# Patient Record
Sex: Male | Born: 1995 | Race: Black or African American | Hispanic: No | Marital: Single | Smoking: Never smoker
Health system: Southern US, Community
[De-identification: ages and names within clinical notes are randomized; demographics above are authoritative.]

## PROBLEM LIST (undated history)

## (undated) DIAGNOSIS — K589 Irritable bowel syndrome without diarrhea: Secondary | ICD-10-CM

## (undated) HISTORY — PX: APPENDECTOMY: SHX54

---
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
## 2016-09-06 ENCOUNTER — Emergency Department (HOSPITAL_COMMUNITY): Payer: BLUE CROSS/BLUE SHIELD

## 2016-09-06 ENCOUNTER — Emergency Department (HOSPITAL_COMMUNITY)
Admission: EM | Admit: 2016-09-06 | Discharge: 2016-09-06 | Disposition: A | Discharge status: Home / Self Care | Payer: BLUE CROSS/BLUE SHIELD | Attending: Emergency Medicine | Admitting: Emergency Medicine

## 2016-09-06 ENCOUNTER — Encounter (HOSPITAL_COMMUNITY): Payer: Self-pay | Admitting: Emergency Medicine

## 2016-09-06 DIAGNOSIS — R079 Chest pain, unspecified: Secondary | ICD-10-CM | POA: Insufficient documentation

## 2016-09-06 DIAGNOSIS — K625 Hemorrhage of anus and rectum: Secondary | ICD-10-CM

## 2016-09-06 HISTORY — DX: Irritable bowel syndrome, unspecified: K58.9

## 2016-09-06 LAB — CBC
HCT: 45.2 % (ref 39.0–52.0)
Hemoglobin: 16 g/dL (ref 13.0–17.0)
MCH: 30.8 pg (ref 26.0–34.0)
MCHC: 35.4 g/dL (ref 30.0–36.0)
MCV: 87.1 fL (ref 78.0–100.0)
Platelets: 184 10*3/uL (ref 150–400)
RBC: 5.19 MIL/uL (ref 4.22–5.81)
RDW: 12.6 % (ref 11.5–15.5)
WBC: 3.3 10*3/uL — ABNORMAL LOW (ref 4.0–10.5)

## 2016-09-06 LAB — BASIC METABOLIC PANEL
Anion gap: 7 (ref 5–15)
BUN: 11 mg/dL (ref 6–20)
CO2: 27 mmol/L (ref 22–32)
Calcium: 9.3 mg/dL (ref 8.9–10.3)
Chloride: 104 mmol/L (ref 101–111)
Creatinine, Ser: 1.04 mg/dL (ref 0.61–1.24)
GFR calc Af Amer: >60 mL/min (ref >60)
GFR calc non Af Amer: >60 mL/min (ref >60)
Glucose, Bld: 95 mg/dL (ref 65–99)
Potassium: 4.1 mmol/L (ref 3.5–5.1)
Sodium: 138 mmol/L (ref 135–145)

## 2016-09-06 LAB — I-STAT TROPONIN, ED: Troponin i, poc: 0 ng/mL (ref 0.00–0.08)

## 2016-09-06 NOTE — ED Provider Notes (Signed)
[facility]-EMERGENCY DEPT Provider Note   CSN: [id number] Arrival date & time: 09/06/16  1407  By signing my name below, I, Rosario AdieWilliam Andrew Hiatt, attest that this documentation has been prepared under the direction and in the presence of Raeford RazorStephen Miro Balderson, MD. Electronically Signed: Rosario AdieWilliam Andrew Hiatt, ED Scribe. 09/06/16. 9:20 PM.  History   Chief Complaint Chief Complaint  Patient presents with  . Chest Pain  . Rectal Bleeding   The history is provided by the patient. No language interpreter was used.    HPI Comments: Trevor Herring is a 21 y.o. male with a h/o IBS, who presents to the Emergency Department complaining of intermittent episodes of posterior neck pain which radiates downward into his lower back onset approximately four days ago. He describes his pain as stinging and aching, and he notes that his episodes of pain are variable in how long they last. Pt additionally states that he will become short of breath, dizzy, and experience intermittent sharp chest pain occasionally accompanied with his neck pain. Pt states that his chest pain will typically radiate into his left arm, and this will occasionally be accompanied by paraesthesias and subjective weakness into his forearm and hand localized around his to his 3rd and 4th digits. He is asymptomatic while in the ED. Pt also reports that he has felt mildly fatigued from his baseline since the onset of his pain. He also notes that over the past two days that he has had two episodes of bright-red rectal bleeding which he has noticed while wiping. No passage of blood with stool otherwise and his recent bowel movements have been soft. No recent straining, heavy lifting, or change in his daily routine. His neck/chest pain is sometimes exacerbated and precipitated with bowel movements, but otherwise there is no pattern to his pain; however, he notes that he has been more anxious d/t his symptoms and has noticed his pain with his increased anxiety. No  noted treatments for his symptoms were tried prior to coming into the ED. No recent illnesses/infections. Pt is currently followed by a PCP. He denies leg swelling, abdominal pain, rectal pain, or any other associated symptoms.   Past Medical History:  Diagnosis Date  . IBS (irritable bowel syndrome)    There are no active problems to display for this patient.  Past Surgical History:  Procedure Laterality Date  . APPENDECTOMY      Home Medications    Prior to Admission medications   Not on File   Family History No family history on file.  Social History Social History  Substance Use Topics  . Smoking status: Never Smoker  . Smokeless tobacco: Not on file  . Alcohol use No   Allergies   Shellfish allergy and Other  Review of Systems Review of Systems  Constitutional: Positive for fever.  Respiratory: Positive for shortness of breath.   Cardiovascular: Positive for chest pain. Negative for leg swelling.  Gastrointestinal: Positive for anal bleeding. Negative for abdominal pain, blood in stool and rectal pain.  Musculoskeletal: Positive for back pain (2/2 radiation) and neck pain.  Neurological: Positive for dizziness.  All other systems reviewed and are negative.  Physical Exam Updated Vital Signs BP 119/79 (BP Location: Left Arm)   Pulse 69   Temp 98.2 F (36.8 C) (Oral)   Resp 18   Ht 6\' 2"  (1.88 m)   Wt 185 lb (83.9 kg)   SpO2 100%   BMI 23.75 kg/m   Physical Exam  Constitutional: He appears well-developed  and well-nourished.  HENT:  Head: Normocephalic.  Right Ear: External ear normal.  Left Ear: External ear normal.  Nose: Nose normal.  Eyes: Conjunctivae are normal. Right eye exhibits no discharge. Left eye exhibits no discharge.  Neck: Normal range of motion.  Cardiovascular: Normal rate, regular rhythm and normal heart sounds.   No murmur heard. Pulmonary/Chest: Effort normal and breath sounds normal. No respiratory distress. He has no wheezes.  He has no rales.  Abdominal: Soft. There is no tenderness. There is no rebound and no guarding.  Musculoskeletal: Normal range of motion. He exhibits no edema or tenderness.  Neurological: He is alert. No cranial nerve deficit. Coordination normal.  Skin: Skin is warm and dry. No erythema. No pallor.  Psychiatric: He has a normal mood and affect. His behavior is normal.  Nursing note and vitals reviewed.  ED Treatments / Results  DIAGNOSTIC STUDIES: Oxygen Saturation is 100% on RA, normal by my interpretation.   COORDINATION OF CARE: 9:20 PM-Discussed next steps with pt. Pt verbalized understanding and is agreeable with the plan.   Labs (all labs ordered are listed, but only abnormal results are displayed) Labs Reviewed  CBC - Abnormal; Notable for the following:       Result Value   WBC 3.3 (*)    All other components within normal limits  BASIC METABOLIC PANEL  I-STAT TROPOININ, ED   EKG  EKG Interpretation None      Radiology Dg Chest 2 View  Result Date: 09/06/2016 CLINICAL DATA:  Anterior LEFT chest pain and pressure radiating into LEFT arm, some shortness of breath and dizziness, intermittent symptoms for 2-3 days, history irritable bowel syndrome EXAM: CHEST  2 VIEW COMPARISON:  None FINDINGS: Normal heart size, mediastinal contours, and pulmonary vascularity. Minimal peribronchial thickening. Lungs clear. No pleural effusion or pneumothorax. Bones unremarkable. IMPRESSION: Minimal bronchitic changes without infiltrate. Electronically Signed   By: Ulyses Southward M.D.   On: 09/06/2016 15:07   Procedures Procedures   Medications Ordered in ED Medications - No data to display  Initial Impression / Assessment and Plan / ED Course  I have reviewed the triage vital signs and the nursing notes.  Pertinent labs & imaging results that were available during my care of the patient were reviewed by me and considered in my medical decision making (see chart for  details).  Clinical Course    21yM with numerous complaints. I have a very low suspicion for an emergent process as the etiology of any of them. He is well appearing. W/u reassuring. It has been determined that no acute conditions requiring further emergency intervention are present at this time. The patient has been advised of the diagnosis and plan. I reviewed any labs and imaging including any potential incidental findings. We have discussed signs and symptoms that warrant return to the ED and they are listed in the discharge instructions.    Final Clinical Impressions(s) / ED Diagnoses   Final diagnoses:  Chest pain, unspecified type  Rectal bleeding   New Prescriptions New Prescriptions   No medications on file   I personally preformed the services scribed in my presence. The recorded information has been reviewed is accurate. Raeford Razor, MD.     Raeford Razor, MD 09/18/16 [phone]

## 2016-09-06 NOTE — ED Triage Notes (Addendum)
Pt from home with complaints of left arm, left sided chest pain and rectal bleed x 4 days. Pt states he also has increased fatigue. Pt denies recent illness and denies heavy lifting. Pain is not reproducible Pt states he has been seen 2 times (yesterday and the day before) for this. Pt has complaints of bright red blood following bowel movements

## 2018-05-20 IMAGING — CR DG CHEST 2V
2 series · 2 of 2 positions shown · non-contrast
Comparison: None

CLINICAL DATA: Anterior LEFT chest pain and pressure radiating into
LEFT arm, some shortness of breath and dizziness, intermittent
symptoms for 2-3 days, history irritable bowel syndrome

EXAM:
CHEST  2 VIEW

[w chest pa]
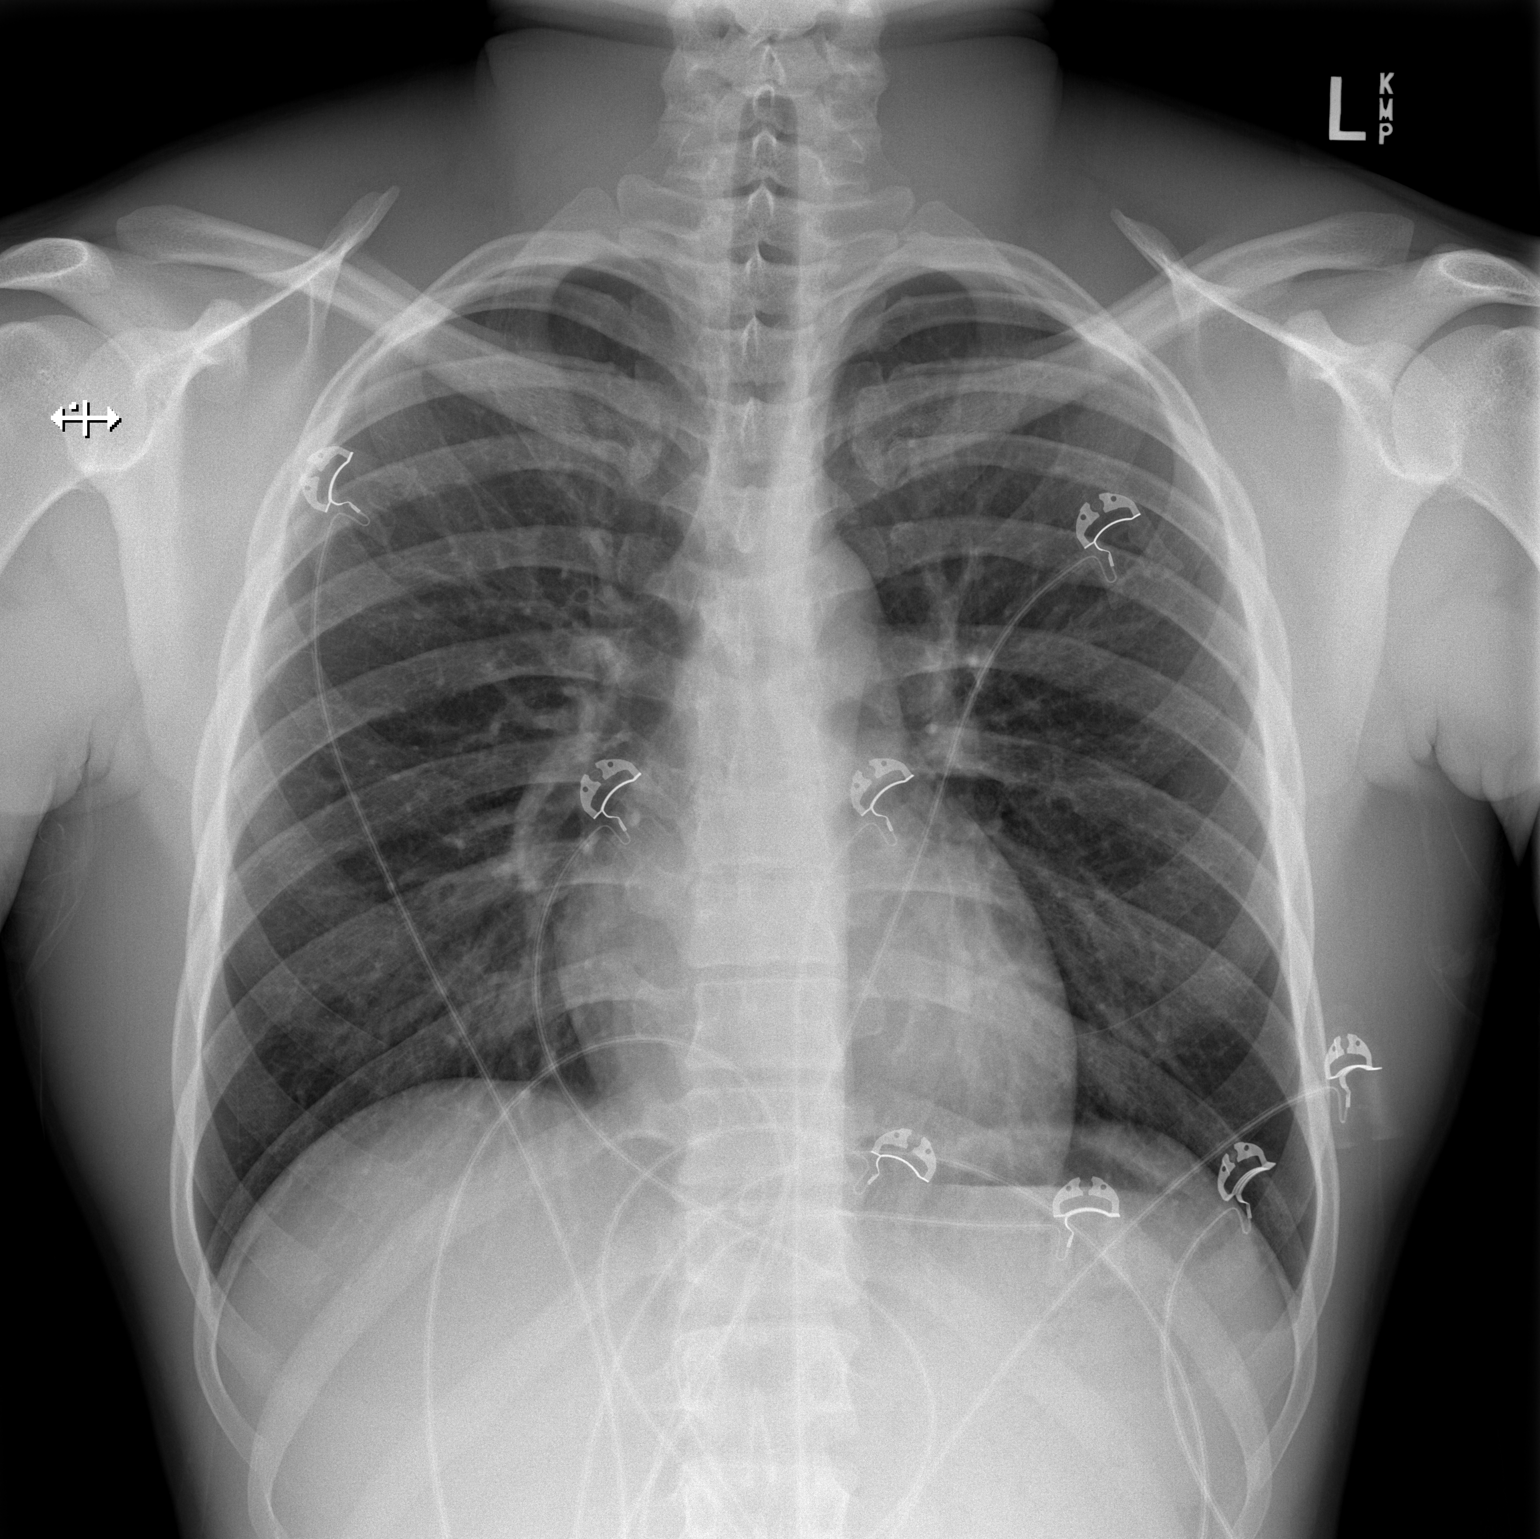

[w chest lat]
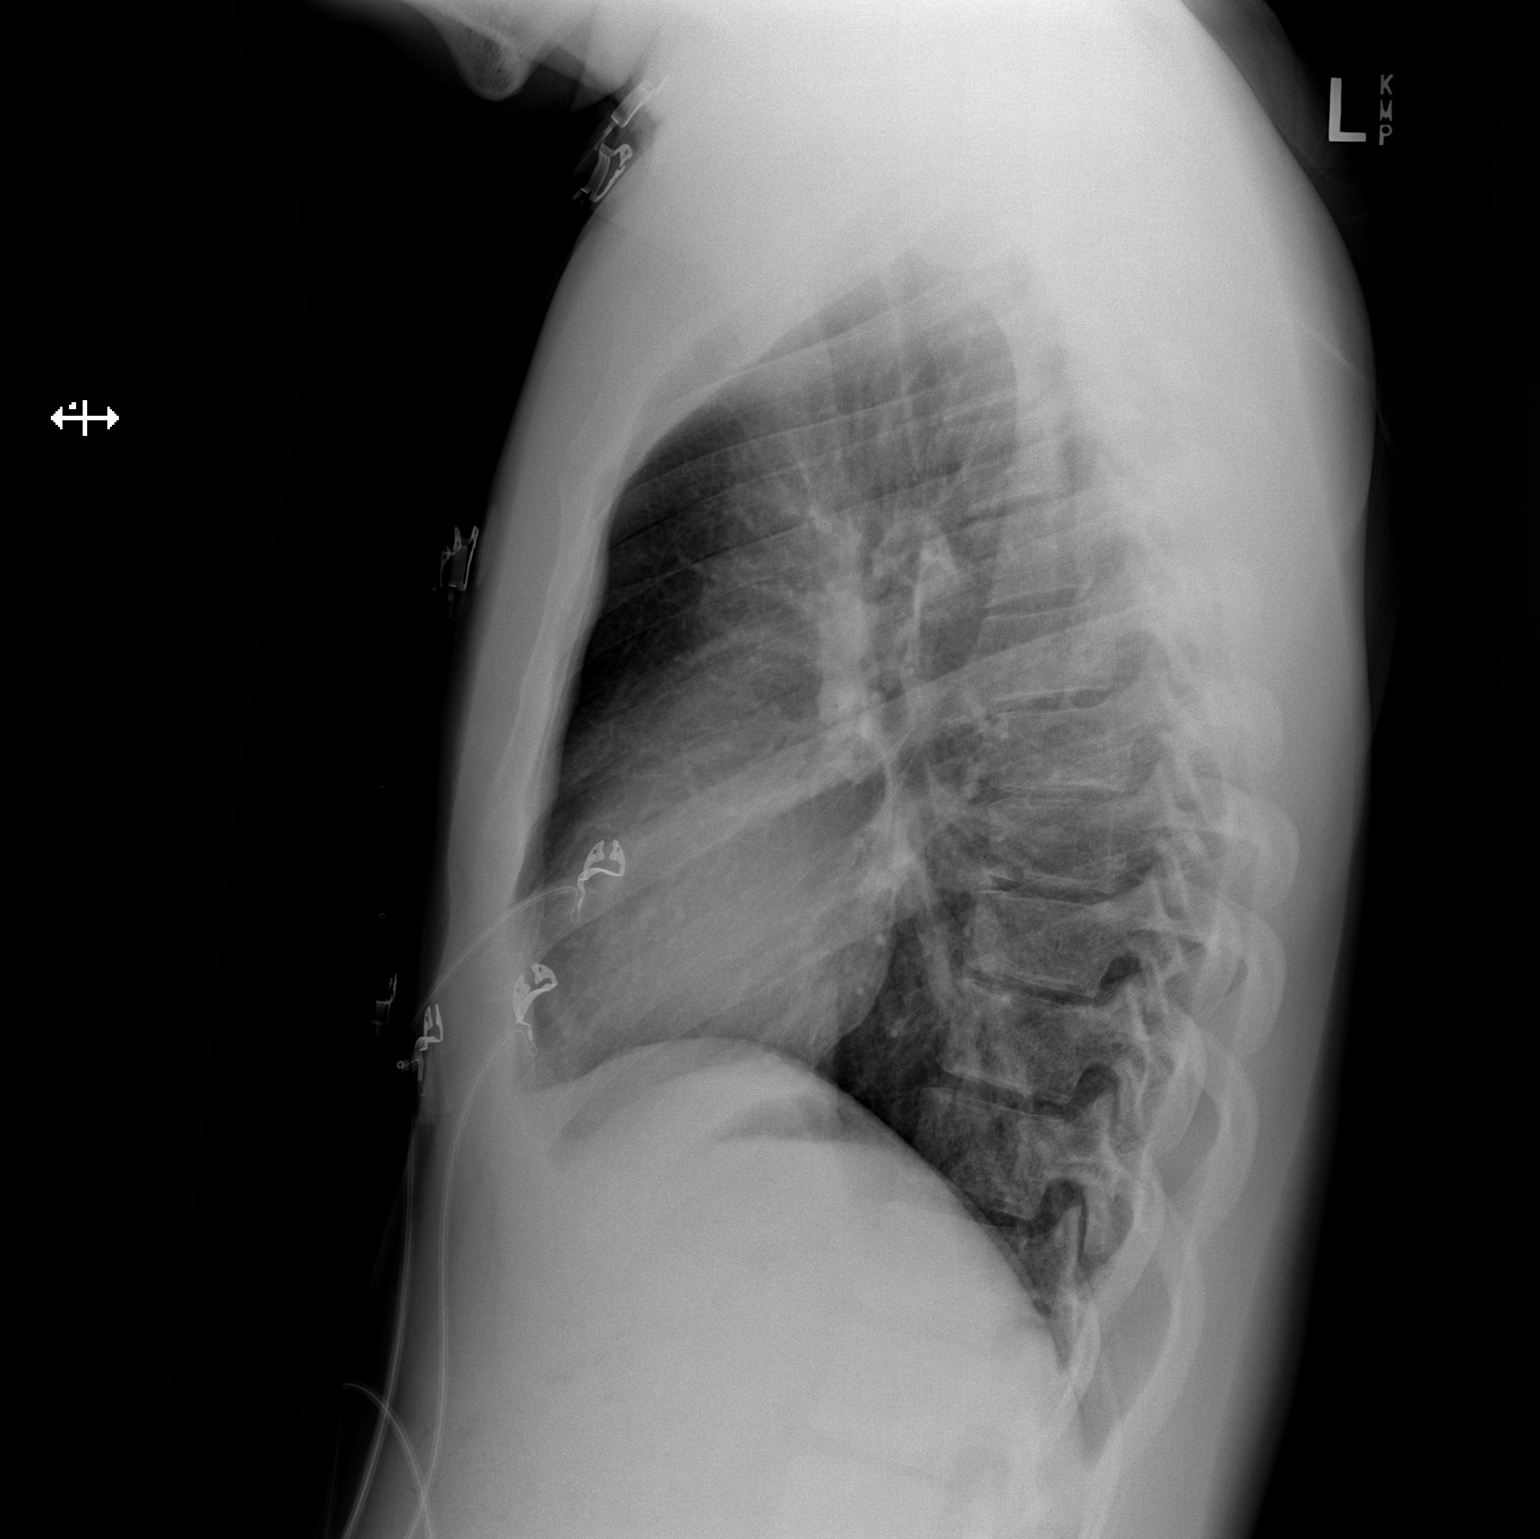

[2 of 2 positions shown; findings below may reference images not displayed]

FINDINGS: Normal heart size, mediastinal contours, and pulmonary vascularity.

Minimal peribronchial thickening.

Lungs clear.

No pleural effusion or pneumothorax.

Bones unremarkable.
IMPRESSION: Minimal bronchitic changes without infiltrate.
